# Patient Record
Sex: Male | Born: 1990 | Race: White | Hispanic: Yes | Marital: Single | Smoking: Never smoker
Health system: Southern US, Community
[De-identification: ages and names within clinical notes are randomized; demographics above are authoritative.]

---

## 2011-04-20 ENCOUNTER — Emergency Department (HOSPITAL_COMMUNITY)
Admission: EM | Admit: 2011-04-20 | Discharge: 2011-04-20 | Disposition: A | Discharge status: Home / Self Care | Payer: Self-pay | Attending: Emergency Medicine | Admitting: Emergency Medicine

## 2011-04-20 DIAGNOSIS — R21 Rash and other nonspecific skin eruption: Secondary | ICD-10-CM | POA: Insufficient documentation

## 2011-04-20 DIAGNOSIS — L509 Urticaria, unspecified: Secondary | ICD-10-CM | POA: Insufficient documentation

## 2012-11-25 ENCOUNTER — Other Ambulatory Visit (HOSPITAL_COMMUNITY): Payer: Self-pay | Admitting: *Deleted

## 2012-11-25 ENCOUNTER — Ambulatory Visit (HOSPITAL_COMMUNITY)
Admission: RE | Admit: 2012-11-25 | Discharge: 2012-11-25 | Disposition: A | Discharge status: Home / Self Care | Payer: Self-pay | Source: Ambulatory Visit | Attending: *Deleted | Admitting: *Deleted

## 2012-11-25 DIAGNOSIS — M79609 Pain in unspecified limb: Secondary | ICD-10-CM | POA: Insufficient documentation

## 2012-11-25 DIAGNOSIS — S6990XA Unspecified injury of unspecified wrist, hand and finger(s), initial encounter: Secondary | ICD-10-CM | POA: Insufficient documentation

## 2012-11-25 DIAGNOSIS — X58XXXA Exposure to other specified factors, initial encounter: Secondary | ICD-10-CM | POA: Insufficient documentation

## 2013-10-20 ENCOUNTER — Encounter (HOSPITAL_COMMUNITY): Payer: Self-pay | Admitting: Emergency Medicine

## 2013-10-20 ENCOUNTER — Emergency Department (HOSPITAL_COMMUNITY): Payer: Self-pay

## 2013-10-20 ENCOUNTER — Emergency Department (HOSPITAL_COMMUNITY)
Admission: EM | Admit: 2013-10-20 | Discharge: 2013-10-20 | Disposition: A | Discharge status: Home / Self Care | Payer: Self-pay | Attending: Emergency Medicine | Admitting: Emergency Medicine

## 2013-10-20 DIAGNOSIS — R079 Chest pain, unspecified: Secondary | ICD-10-CM

## 2013-10-20 DIAGNOSIS — R0789 Other chest pain: Secondary | ICD-10-CM | POA: Insufficient documentation

## 2013-10-20 DIAGNOSIS — R209 Unspecified disturbances of skin sensation: Secondary | ICD-10-CM | POA: Insufficient documentation

## 2013-10-20 DIAGNOSIS — M546 Pain in thoracic spine: Secondary | ICD-10-CM | POA: Insufficient documentation

## 2013-10-20 LAB — CBC WITH DIFFERENTIAL/PLATELET
Basophils Relative: 0 % (ref 0–1)
Hemoglobin: 15.6 g/dL (ref 13.0–17.0)
Lymphs Abs: 2.3 10*3/uL (ref 0.7–4.0)
Monocytes Relative: 6 % (ref 3–12)
Neutro Abs: 5.2 10*3/uL (ref 1.7–7.7)
Neutrophils Relative %: 63 % (ref 43–77)
Platelets: 230 10*3/uL (ref 150–400)
RBC: 5.4 MIL/uL (ref 4.22–5.81)

## 2013-10-20 LAB — COMPREHENSIVE METABOLIC PANEL
Albumin: 4.6 g/dL (ref 3.5–5.2)
BUN: 17 mg/dL (ref 6–23)
Chloride: 112 mEq/L (ref 96–112)
Creatinine, Ser: 0.82 mg/dL (ref 0.50–1.35)
GFR calc Af Amer: >90 mL/min (ref >90)
Glucose, Bld: 102 mg/dL — ABNORMAL HIGH (ref 70–99)
Potassium: 4.1 mEq/L (ref 3.5–5.1)
Total Bilirubin: 0.8 mg/dL (ref 0.3–1.2)
Total Protein: 7.5 g/dL (ref 6.0–8.3)

## 2013-10-20 LAB — TROPONIN I: Troponin I: <0.3 ng/mL (ref <0.30)

## 2013-10-20 LAB — LIPASE, BLOOD: Lipase: 40 U/L (ref 11–59)

## 2013-10-20 MED ORDER — FAMOTIDINE 20 MG PO TABS: 20.0000 mg | ORAL_TABLET | Freq: Two times a day (BID) | ORAL | Status: AC

## 2013-10-20 MED ORDER — GI COCKTAIL ~~LOC~~
30.0000 mL | Freq: Once | ORAL | Status: AC
  Administered 2013-10-20: 30 mL via ORAL
  Filled 2013-10-20: qty 30

## 2013-10-20 NOTE — ED Notes (Signed)
Pain lt side of chest "for years" but is getting worse. No known injury Pain extends around to lt upper back.

## 2013-10-20 NOTE — ED Notes (Signed)
Pt c/o sharp pain in left chest off and on for "years."  Reports over the past 3 months, pain has been more frequent and stronger.  Also reports numbness in left hand x 1 year.  Denies any injury.  Reports advil helps the pain for a little bit but then comes back.

## 2013-10-21 NOTE — ED Provider Notes (Signed)
CSN: [id number]     Arrival date & time 10/20/13  1540 History   First MD Initiated Contact with Patient 10/20/13 1737     Chief Complaint  Patient presents with  . Chest Pain   (Consider location/radiation/quality/duration/timing/severity/associated sxs/prior Treatment) HPI Comments: Jonathan Thornton is a 22 y.o. Male presenting with a several year history of infrequent episodes of pressure with sharp episodic stabs of  left sided chest and upper back pain which has become more frequent and intense the past 3 months.  He reports nearly daily episodes,  Some lasting a few minutes, other episodes will last for hours and has current mild symptoms, last intense episode occurred last night.  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  He does report having occasional heartburn, but this pain is "different".  It is not accompanied by shortness of breath, nausea, vomiting or diaphoresis.  He also reports waking some mornings for approximately the past year with left sided hand numbness which improves with movement.  He denies weakness, neck pain or injury, headaches.  He is right handed.  He has not tried any medicines for pain relief.  He has not sought medical treatment prior to today for these concerns.  He does not have a family history of cardiac concerns.  He denies cocaine use and is not a smoker.     The history is provided by the patient. The history is limited by a language barrier. A language interpreter was used.    History reviewed. No pertinent past medical history. History reviewed. No pertinent past surgical history. No family history on file. History  Substance Use Topics  . Smoking status: Never Smoker   . Smokeless tobacco: Not on file  . Alcohol Use: No    Review of Systems  Constitutional: Negative for fever  and chills.  HENT: Negative for congestion and sore throat.   Eyes: Negative.   Respiratory: Negative for cough, chest tightness and shortness of breath.   Cardiovascular: Positive for chest pain. Negative for palpitations.  Gastrointestinal: Negative for nausea, vomiting and abdominal pain.  Genitourinary: Negative.   Musculoskeletal: Negative for arthralgias, back pain, joint swelling and neck pain.  Skin: Negative.  Negative for rash and wound.  Neurological: Positive for numbness. Negative for dizziness, weakness, light-headedness and headaches.  Psychiatric/Behavioral: Negative.     Allergies  Review of patient's allergies indicates no known allergies.  Home Medications   Current Outpatient Rx  Name  Route  Sig  Dispense  Refill  . famotidine (PEPCID) 20 MG tablet   Oral   Take 1 tablet (20 mg total) by mouth 2 (two) times daily.   30 tablet   0    BP 109/55  Pulse 90  Temp(Src) 97.8 F (36.6 C) (Oral)  Resp 18  Wt 142 lb (64.411 kg)  SpO2 100% Physical Exam  Nursing note and vitals reviewed. Constitutional: He appears well-developed and well-nourished.  HENT:  Head: Normocephalic and atraumatic.  Eyes: Conjunctivae are normal.  Neck: Normal range of motion.  Cardiovascular: Normal rate, regular rhythm, S1 normal, S2 normal, normal heart sounds and intact distal pulses.   No murmur heard. Pulmonary/Chest: Effort normal and breath sounds normal. He has no wheezes.  Abdominal: Soft. Bowel sounds are normal. There is no tenderness.  Musculoskeletal: Normal range of motion.  Neurological:  He is alert. He has normal strength. No sensory deficit.  Reflex Scores:      Tricep reflexes are 2+ on the right side and 2+ on the left side.      Bicep reflexes are 2+ on the right side and 2+ on the left side. Equal grip strength  Skin: Skin is warm and dry.  Psychiatric: He has a normal mood and affect.    ED Course  Procedures (including critical care time) Labs  Review Labs Reviewed  COMPREHENSIVE METABOLIC PANEL - Abnormal; Notable for the following:    Sodium 125 (*)    Glucose, Bld 102 (*)    All other components within normal limits  CBC WITH DIFFERENTIAL  LIPASE, BLOOD  TROPONIN I  SODIUM   Imaging Review Dg Chest 2 View  10/20/2013   CLINICAL DATA:  Chest pain  EXAM: CHEST  2 VIEW  COMPARISON:  None.  FINDINGS: The heart size and mediastinal contours are within normal limits. Both lungs are clear. The visualized skeletal structures are unremarkable.  IMPRESSION: No active cardiopulmonary disease.   Electronically Signed   By: Ruel Favors M.D.   On: 10/20/2013 16:31    EKG Interpretation   None       MDM   1. Chest pain at rest    Pt discussed with Dr. Lynelle Doctor prior to dc home.  Labs, chest xray, ekg reviewed.  Pt was given GI cocktail which he states did not improve his sx, but by time of dc,  His symptoms WERE improved.  He was prescribed pepcid, encouraged further evaluation and management of these chronic sx by pcp, referrals given for this.    The patient appears reasonably screened and/or stabilized for discharge and I doubt any other medical condition or other [facility] requiring further screening, evaluation, or treatment in the ED at this time prior to discharge.    Date: 10/20/2013  Rate: 75  Rhythm: sinus arrhythmia  QRS Axis: right  Intervals: normal  ST/T Wave abnormalities: nonspecific T wave changes  Conduction Disutrbances:none  Narrative Interpretation:   Old EKG Reviewed: none available    Burgess Amor, PA-C 10/21/13 1356

## 2013-10-21 NOTE — ED Provider Notes (Signed)
Medical screening examination/treatment/procedure(s) were performed by non-physician practitioner and as supervising physician I was immediately available for consultation/collaboration.    Guyla Bless R Kimaria Struthers, MD 10/21/13 2259 

## 2015-01-25 ENCOUNTER — Other Ambulatory Visit (HOSPITAL_COMMUNITY): Payer: Self-pay

## 2015-02-01 ENCOUNTER — Ambulatory Visit (HOSPITAL_COMMUNITY)
Admission: RE | Admit: 2015-02-01 | Discharge: 2015-02-01 | Disposition: A | Discharge status: Home / Self Care | Payer: Self-pay | Source: Ambulatory Visit | Attending: Nurse Practitioner | Admitting: Nurse Practitioner

## 2015-02-01 DIAGNOSIS — R079 Chest pain, unspecified: Secondary | ICD-10-CM

## 2015-02-01 NOTE — Progress Notes (Signed)
  Echocardiogram 2D Echocardiogram has been performed.  Christoher Drudge J 02/01/2015, 2:04 PM 

## 2015-03-11 ENCOUNTER — Ambulatory Visit (HOSPITAL_COMMUNITY)
Admission: RE | Admit: 2015-03-11 | Discharge: 2015-03-11 | Disposition: A | Discharge status: Home / Self Care | Payer: Self-pay | Source: Ambulatory Visit | Attending: Nurse Practitioner | Admitting: Nurse Practitioner

## 2015-03-11 ENCOUNTER — Other Ambulatory Visit (HOSPITAL_COMMUNITY): Payer: Self-pay | Admitting: Nurse Practitioner

## 2015-03-11 DIAGNOSIS — M25519 Pain in unspecified shoulder: Secondary | ICD-10-CM | POA: Insufficient documentation

## 2015-03-11 DIAGNOSIS — M542 Cervicalgia: Secondary | ICD-10-CM

## 2016-03-02 IMAGING — DX DG SHOULDER 2+V*L*
3 series · 3 of 3 positions shown · non-contrast
Comparison: None.

CLINICAL DATA: LEFT neck and shoulder pain.

EXAM:
LEFT SHOULDER - 2+ VIEW

[shoulder grashey]
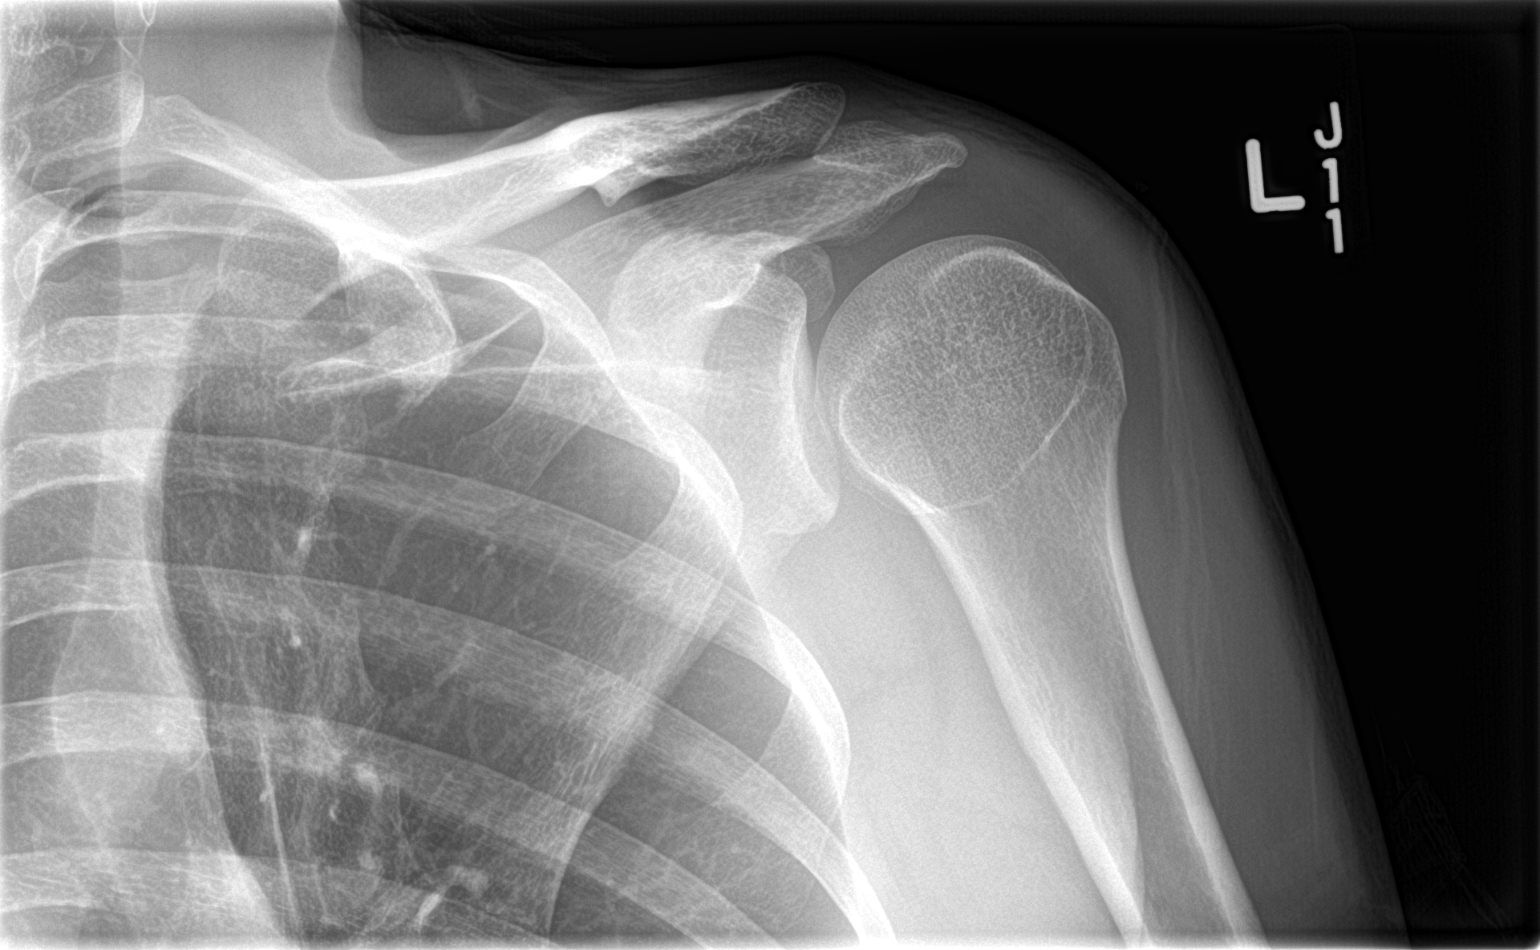

[shoulder y view]
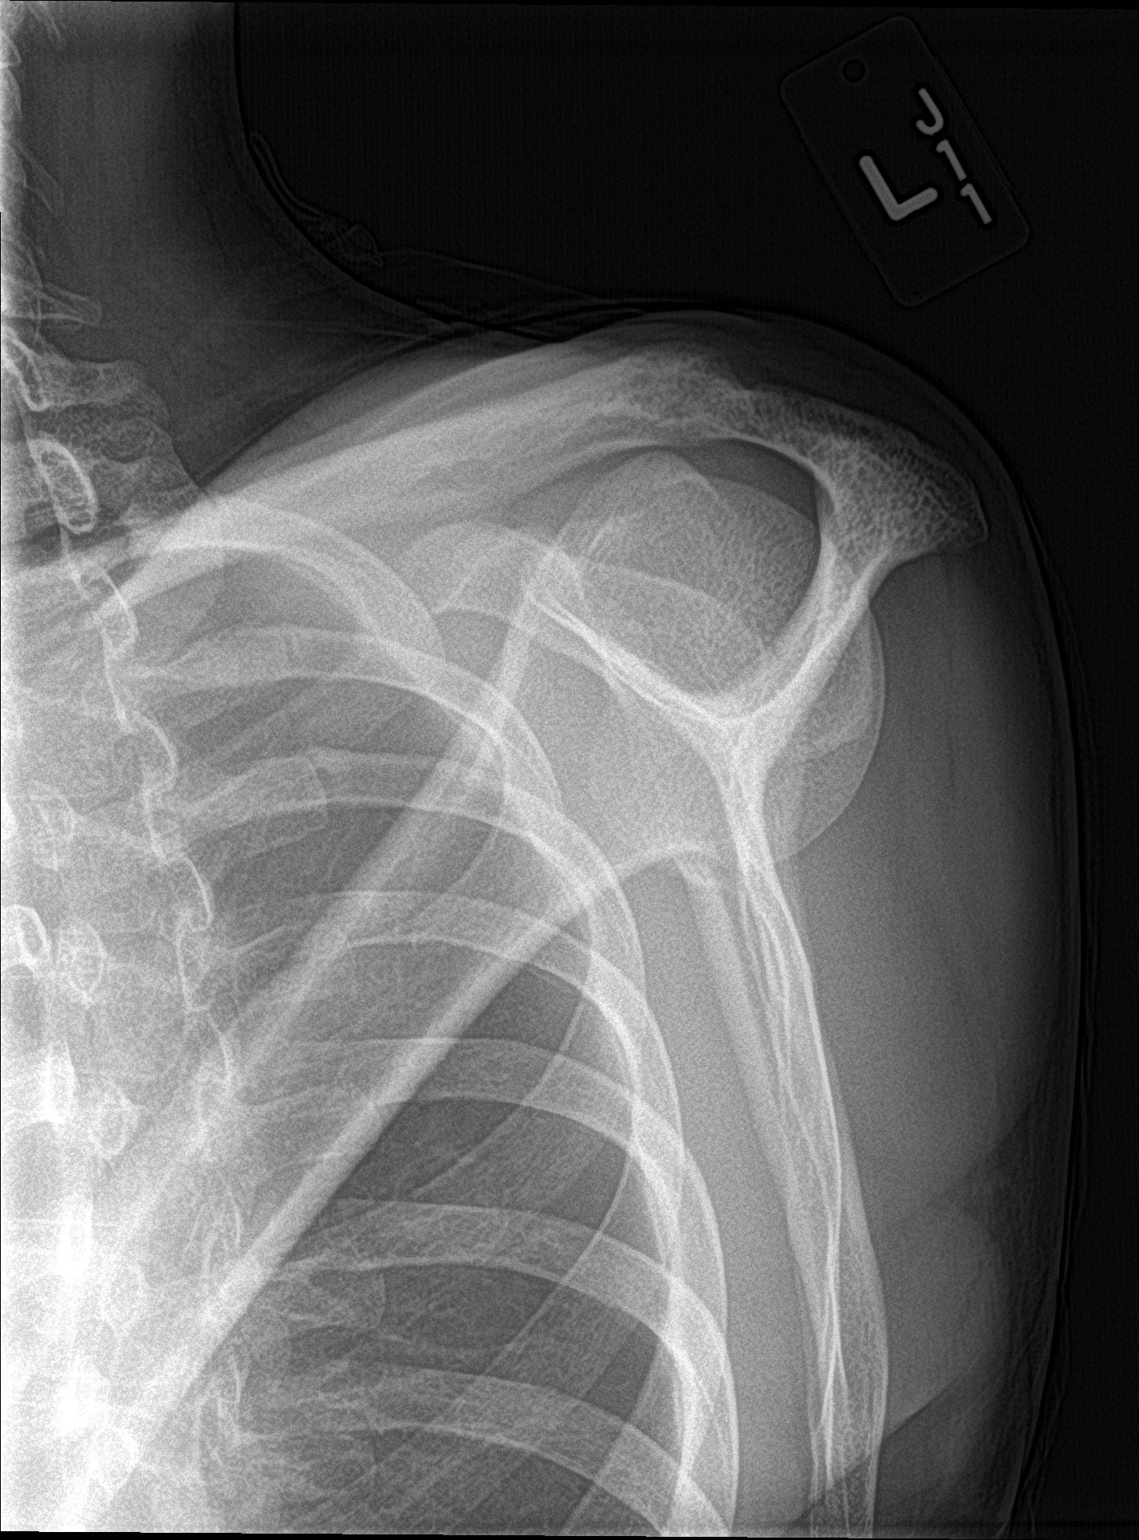

[shoulder axillary]
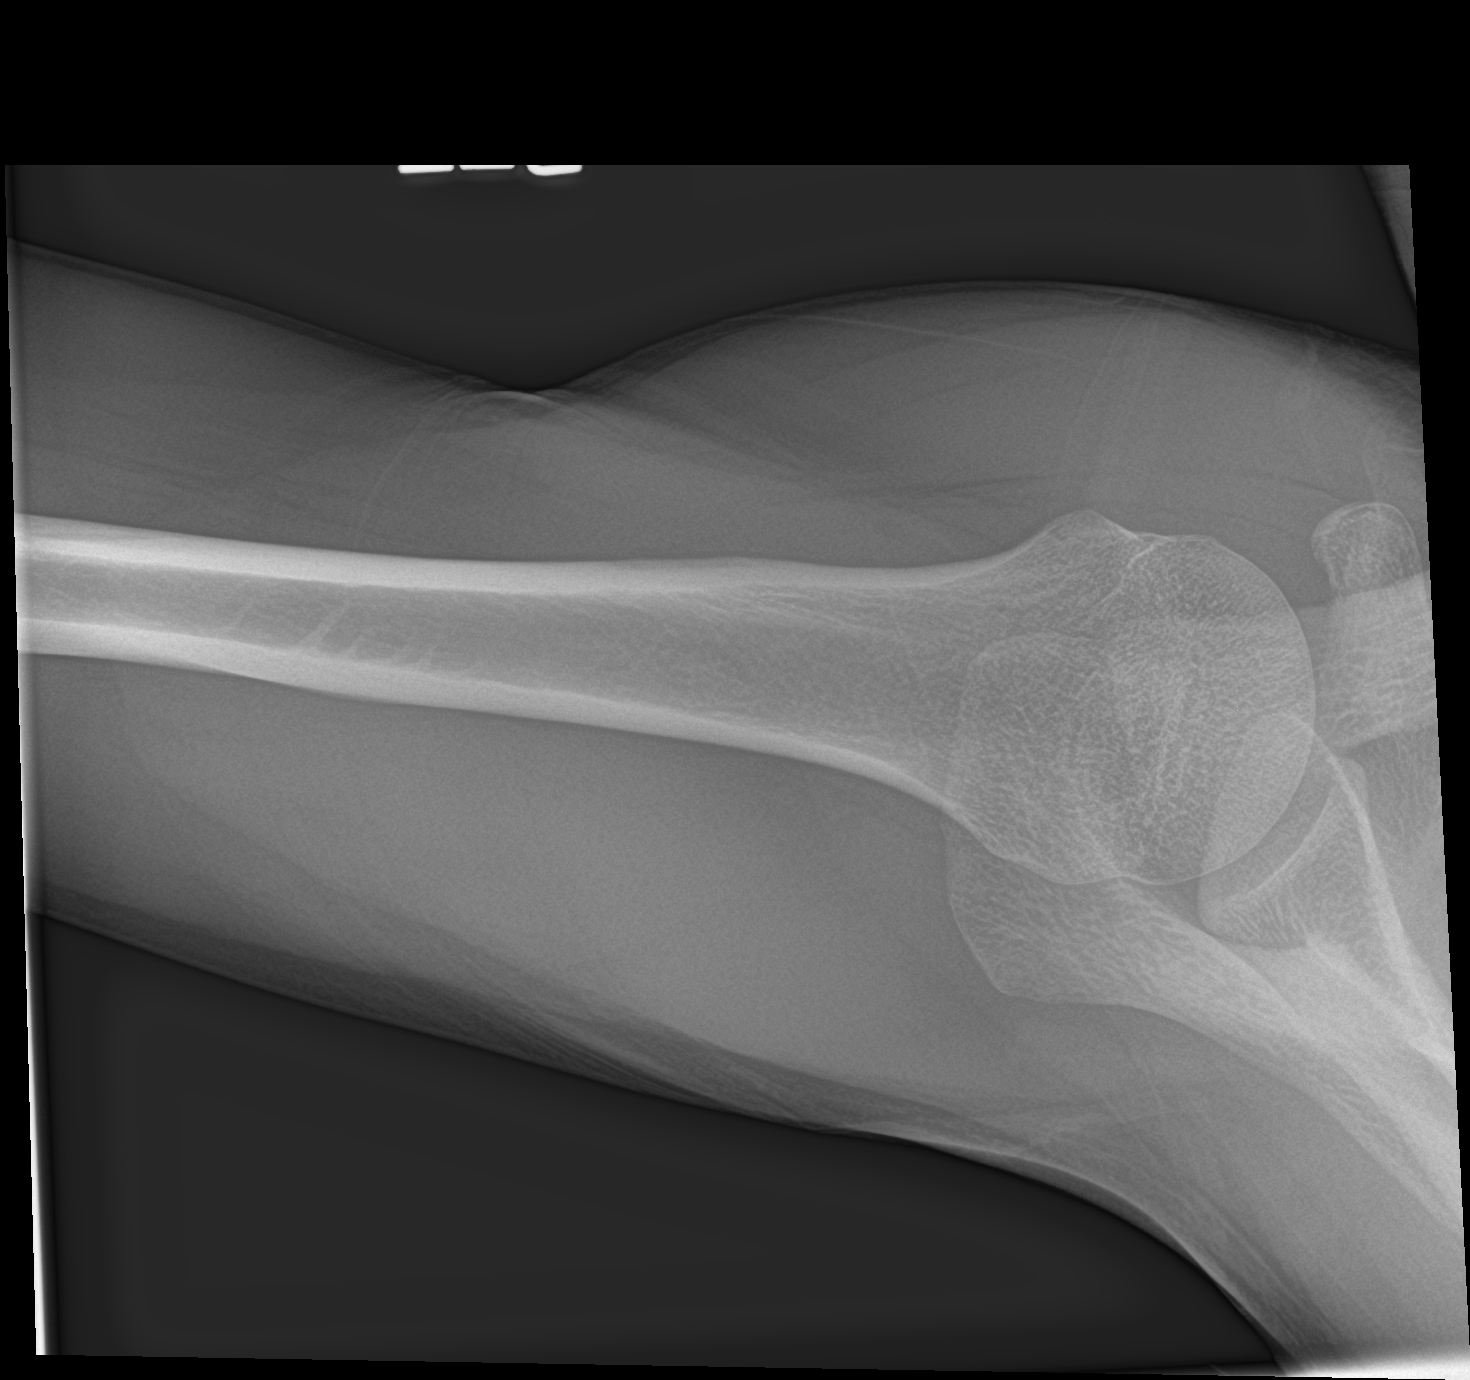

[3 of 3 positions shown; findings below may reference images not displayed]

FINDINGS: There is no evidence of fracture or dislocation. There is no
evidence of arthropathy or other focal bone abnormality. Soft
tissues are unremarkable.
IMPRESSION: Negative.
